# Patient Record
Sex: Female | Born: 1970 | Race: Black or African American | Hispanic: No | Marital: Single | Smoking: Never smoker
Health system: Southern US, Community
[De-identification: ages and names within clinical notes are randomized; demographics above are authoritative.]

## PROBLEM LIST (undated history)

## (undated) DIAGNOSIS — Z789 Other specified health status: Secondary | ICD-10-CM

## (undated) HISTORY — PX: NO PAST SURGERIES: SHX2092

---

## 2011-12-11 ENCOUNTER — Emergency Department (HOSPITAL_COMMUNITY)
Admission: EM | Admit: 2011-12-11 | Discharge: 2011-12-11 | Discharge status: Against Medical Advice | Payer: PRIVATE HEALTH INSURANCE | Attending: Emergency Medicine | Admitting: Emergency Medicine

## 2011-12-11 DIAGNOSIS — R42 Dizziness and giddiness: Secondary | ICD-10-CM | POA: Insufficient documentation

## 2011-12-11 DIAGNOSIS — R51 Headache: Secondary | ICD-10-CM | POA: Insufficient documentation

## 2012-08-23 ENCOUNTER — Inpatient Hospital Stay (HOSPITAL_COMMUNITY)
Admission: AD | Admit: 2012-08-23 | Discharge: 2012-08-23 | Disposition: A | Discharge status: Home / Self Care | Payer: Medicaid Other | Source: Ambulatory Visit | Attending: Obstetrics & Gynecology | Admitting: Obstetrics & Gynecology

## 2012-08-23 ENCOUNTER — Inpatient Hospital Stay (HOSPITAL_COMMUNITY): Payer: Medicaid Other

## 2012-08-23 ENCOUNTER — Encounter (HOSPITAL_COMMUNITY): Payer: Self-pay | Admitting: *Deleted

## 2012-08-23 DIAGNOSIS — N949 Unspecified condition associated with female genital organs and menstrual cycle: Secondary | ICD-10-CM

## 2012-08-23 DIAGNOSIS — N938 Other specified abnormal uterine and vaginal bleeding: Secondary | ICD-10-CM | POA: Insufficient documentation

## 2012-08-23 HISTORY — DX: Other specified health status: Z78.9

## 2012-08-23 LAB — URINALYSIS, ROUTINE W REFLEX MICROSCOPIC
Glucose, UA: NEGATIVE mg/dL
Protein, ur: >300 mg/dL — AB
pH: 6.5 (ref 5.0–8.0)

## 2012-08-23 LAB — WET PREP, GENITAL
Clue Cells Wet Prep HPF POC: NONE SEEN
Trich, Wet Prep: NONE SEEN
WBC, Wet Prep HPF POC: NONE SEEN
Yeast Wet Prep HPF POC: NONE SEEN

## 2012-08-23 LAB — CBC
HCT: 32.2 % — ABNORMAL LOW (ref 36.0–46.0)
MCH: 27.2 pg (ref 26.0–34.0)
MCHC: 32.9 g/dL (ref 30.0–36.0)
MCV: 82.6 fL (ref 78.0–100.0)
Platelets: 223 10*3/uL (ref 150–400)
RDW: 15.4 % (ref 11.5–15.5)
WBC: 3.2 10*3/uL — ABNORMAL LOW (ref 4.0–10.5)

## 2012-08-23 LAB — POCT PREGNANCY, URINE: Preg Test, Ur: NEGATIVE

## 2012-08-23 MED ORDER — MEDROXYPROGESTERONE ACETATE 10 MG PO TABS: 10.0000 mg | ORAL_TABLET | Freq: Every day | ORAL | Status: DC

## 2012-08-23 MED ORDER — SULFAMETHOXAZOLE-TMP DS 800-160 MG PO TABS: 1.0000 | ORAL_TABLET | Freq: Two times a day (BID) | ORAL | Status: DC

## 2012-08-23 NOTE — MAU Provider Note (Signed)
History     CSN: [id number]  Arrival date and time: 08/23/12 1057   First Provider Initiated Contact with Patient 08/23/12 1213      Chief Complaint  Patient presents with  . Vaginal Bleeding   HPI  42 y.o. [id number] with heavy vaginal bleeding since yesterday, passing golf ball to baseball sized clots since yesterday afternoon. Mild menstrual cramps. Periods are usually not heavy. This is the time that she expected her regular period to start.    Past Medical History  Diagnosis Date  . No pertinent past medical history     Past Surgical History  Procedure Date  . No past surgeries     Family History  Problem Relation Age of Onset  . Other Neg Hx     History  Substance Use Topics  . Smoking status: Never Smoker   . Smokeless tobacco: Not on file  . Alcohol Use: No    Allergies: No Known Allergies  Prescriptions prior to admission  Medication Sig Dispense Refill  . diphenhydrAMINE (BENADRYL) 25 MG tablet Take 25 mg by mouth daily as needed. For itching        Review of Systems  Constitutional: Positive for malaise/fatigue.  Respiratory: Negative.   Cardiovascular: Negative.   Gastrointestinal: Positive for abdominal pain. Negative for nausea, vomiting, diarrhea and constipation.  Genitourinary: Negative for dysuria, urgency, frequency, hematuria and flank pain.       Positive for vaginal bleeding   Musculoskeletal: Negative.   Neurological: Negative.   Psychiatric/Behavioral: Negative.    Physical Exam   Blood pressure 109/67, pulse 83, temperature 97.9 F (36.6 C), temperature source Oral, resp. rate 18, height 5\' 4"  (1.626 m), weight 190 lb (86.183 kg), last menstrual period 07/26/2012.  Physical Exam  Nursing note and vitals reviewed. Constitutional: She is oriented to person, place, and time. She appears well-developed and well-nourished. No distress.  HENT:  Head: Normocephalic and atraumatic.  Cardiovascular: Normal rate.   Respiratory: Effort  normal. No respiratory distress.  GI: Soft. Bowel sounds are normal. She exhibits no distension and no mass. There is no tenderness. There is no rebound and no guarding.  Genitourinary: There is no rash or lesion on the right labia. There is no rash or lesion on the left labia. Uterus is not deviated, not enlarged, not fixed and not tender. Cervix exhibits no motion tenderness, no discharge and no friability. Right adnexum displays no mass, no tenderness and no fullness. Left adnexum displays no mass, no tenderness and no fullness. There is bleeding (heavy with moderate clots) around the vagina. No erythema or tenderness around the vagina. No vaginal discharge found.  Neurological: She is alert and oriented to person, place, and time.  Skin: Skin is warm and dry.  Psychiatric: She has a normal mood and affect.    MAU Course  Procedures Results for orders placed during the hospital encounter of 08/23/12 (from the past 24 hour(s))  URINALYSIS, ROUTINE W REFLEX MICROSCOPIC     Status: Abnormal   Collection Time   08/23/12 11:04 AM      Component Value Range   Color, Urine RED (*) YELLOW   APPearance CLOUDY (*) CLEAR   Specific Gravity, Urine 1.015  1.005 - 1.030   pH 6.5  5.0 - 8.0   Glucose, UA NEGATIVE  NEGATIVE mg/dL   Hgb urine dipstick LARGE (*) NEGATIVE   Bilirubin Urine NEGATIVE  NEGATIVE   Ketones, ur 15 (*) NEGATIVE mg/dL   Protein, ur >696 (*) NEGATIVE  mg/dL   Urobilinogen, UA 1.0  0.0 - 1.0 mg/dL   Nitrite POSITIVE (*) NEGATIVE   Leukocytes, UA TRACE (*) NEGATIVE  URINE MICROSCOPIC-ADD ON     Status: Normal   Collection Time   08/23/12 11:04 AM      Component Value Range   RBC / HPF TOO NUMEROUS TO COUNT  <3 RBC/hpf   Urine-Other MICROSCOPIC EXAM PERFORMED ON UNCONCENTRATED URINE    POCT PREGNANCY, URINE     Status: Normal   Collection Time   08/23/12 11:14 AM      Component Value Range   Preg Test, Ur NEGATIVE  NEGATIVE  CBC     Status: Abnormal   Collection Time   08/23/12  11:35 AM      Component Value Range   WBC 3.2 (*) 4.0 - 10.5 K/uL   RBC 3.90  3.87 - 5.11 MIL/uL   Hemoglobin 10.6 (*) 12.0 - 15.0 g/dL   HCT 16.1 (*) 09.6 - 04.5 %   MCV 82.6  78.0 - 100.0 fL   MCH 27.2  26.0 - 34.0 pg   MCHC 32.9  30.0 - 36.0 g/dL   RDW 40.9  81.1 - 91.4 %   Platelets 223  150 - 400 K/uL   US Transvaginal Non-ob  08/23/2012  *RADIOLOGY REPORT*  Clinical Data: Heavy vaginal bleeding.  LMP 08/22/2012  TRANSABDOMINAL AND TRANSVAGINAL ULTRASOUND OF PELVIS Technique:  Both transabdominal and transvaginal ultrasound examinations of the pelvis were performed. Transabdominal technique was performed for global imaging of the pelvis including uterus, ovaries, adnexal regions, and pelvic cul-de-sac.  It was necessary to proceed with endovaginal exam following the transabdominal exam to visualize the myometrium, endometrium and adnexa.  Comparison:  None  Findings:  Uterus: Is anteverted and anteflexed and demonstrates a sagittal length of 9.4 cm, depth of 4.5 cm and width of 5.1 cm.  A homogeneous myometrium is seen  Endometrium: Appears thin with a width of 5 mm.  No areas of focal thickening or heterogeneity are identified. This would correlate with a postsecretory endometrial stripe and corresponds with the provided LMP of 08/22/2012  Right ovary:  Has a normal appearance measuring 2.5 x 1.7 x 2.1 cm  Left ovary: Has a normal appearance measuring 2.4 x 1.6 x 2.4 cm  Other findings: A thin walled unilocular simple cyst is noted in the right adnexa measuring 1.4 x 1.3 x 1.3 cm and compatible with a benign paraovarian cyst.  No pelvic fluid is seen.  IMPRESSION: Normal myometrium, endometrium and ovaries.  Benign appearing right paraovarian cyst.  Given the size and appearance in a premenopausal patient, no further follow up for this finding is needed This recommendation follows the consensus statement:  Management of Asymptomatic Ovarian and Other Adnexal Cysts Imaged at [place]:  Society of  Radiologists in Ultrasound Consensus Conference Statement.  Radiology 2010; [phone]. .   Original Report Authenticated By: Rhodia Albright, M.D.    US Pelvis Complete  08/23/2012  *RADIOLOGY REPORT*  Clinical Data: Heavy vaginal bleeding.  LMP 08/22/2012  TRANSABDOMINAL AND TRANSVAGINAL ULTRASOUND OF PELVIS Technique:  Both transabdominal and transvaginal ultrasound examinations of the pelvis were performed. Transabdominal technique was performed for global imaging of the pelvis including uterus, ovaries, adnexal regions, and pelvic cul-de-sac.  It was necessary to proceed with endovaginal exam following the transabdominal exam to visualize the myometrium, endometrium and adnexa.  Comparison:  None  Findings:  Uterus: Is anteverted and anteflexed and demonstrates a sagittal length of 9.4 cm,  depth of 4.5 cm and width of 5.1 cm.  A homogeneous myometrium is seen  Endometrium: Appears thin with a width of 5 mm.  No areas of focal thickening or heterogeneity are identified. This would correlate with a postsecretory endometrial stripe and corresponds with the provided LMP of 08/22/2012  Right ovary:  Has a normal appearance measuring 2.5 x 1.7 x 2.1 cm  Left ovary: Has a normal appearance measuring 2.4 x 1.6 x 2.4 cm  Other findings: A thin walled unilocular simple cyst is noted in the right adnexa measuring 1.4 x 1.3 x 1.3 cm and compatible with a benign paraovarian cyst.  No pelvic fluid is seen.  IMPRESSION: Normal myometrium, endometrium and ovaries.  Benign appearing right paraovarian cyst.  Given the size and appearance in a premenopausal patient, no further follow up for this finding is needed This recommendation follows the consensus statement:  Management of Asymptomatic Ovarian and Other Adnexal Cysts Imaged at [place]:  Society of Radiologists in Ultrasound Consensus Conference Statement.  Radiology 2010; [phone]. .   Original Report Authenticated By: Rhodia Albright, M.D.      Assessment and Plan    1. DUB (dysfunctional uterine bleeding)   Heavy period - normal u/s, Hgb 10.6. Gave rx for provera - pt may wait to see if this period stops within the next few days as her cycles are normally about 5 days long, but may take provera to stop bleeding if desired/needed. F/U in clinic.     Medication List     As of 08/23/2012  6:16 PM    START taking these medications         medroxyPROGESTERone 10 MG tablet   Commonly known as: PROVERA   Take 1 tablet (10 mg total) by mouth daily.      sulfamethoxazole-trimethoprim 800-160 MG per tablet   Commonly known as: BACTRIM DS   Take 1 tablet by mouth 2 (two) times daily.      CONTINUE taking these medications         diphenhydrAMINE 25 MG tablet   Commonly known as: BENADRYL          Where to get your medications    These are the prescriptions that you need to pick up. We sent them to a specific pharmacy, so you will need to go there to get them.   [id number] [place], [place] - [address]    [address] [place] [address]    Phone: [phone]        medroxyPROGESTERone 10 MG tablet   sulfamethoxazole-trimethoprim 800-160 MG per tablet            Follow-up Information    Follow up with [facility]. (someone will call to schedule appointment)    Contact information:   [address] [place] [address] [phone]           Bronco Mcgrory 08/23/2012, 12:30 PM

## 2012-08-23 NOTE — MAU Note (Signed)
C/o heavy bleeding since 1400 yesterday with "golf ball to baseball size clots" with no pain; no BC;

## 2012-09-09 ENCOUNTER — Encounter: Payer: PRIVATE HEALTH INSURANCE | Admitting: Obstetrics & Gynecology

## 2012-09-23 ENCOUNTER — Encounter: Payer: Self-pay | Admitting: Obstetrics & Gynecology

## 2012-09-23 ENCOUNTER — Ambulatory Visit (INDEPENDENT_AMBULATORY_CARE_PROVIDER_SITE_OTHER): Payer: Medicaid Other | Admitting: Obstetrics & Gynecology

## 2012-09-23 VITALS — BP 109/73 | HR 82 | Temp 97.9°F | Ht 64.0 in | Wt 210.0 lb

## 2012-09-23 DIAGNOSIS — N920 Excessive and frequent menstruation with regular cycle: Secondary | ICD-10-CM

## 2012-09-23 NOTE — Patient Instructions (Addendum)
Endometrial Ablation Endometrial ablation removes the lining of the uterus (endometrium). It is usually a same day, outpatient treatment. Ablation helps avoid major surgery (such as a hysterectomy). A hysterectomy is removal of the cervix and uterus. Endometrial ablation has less risk and complications, has a shorter recovery period and is less expensive. After endometrial ablation, most women will have little or no menstrual bleeding. You may not keep your fertility. Pregnancy is no longer likely after this procedure but if you are pre-menopausal, you still need to use a reliable method of birth control following the procedure because pregnancy can occur. REASONS TO HAVE THE PROCEDURE MAY INCLUDE:  Heavy periods.  Bleeding that is causing anemia.  Anovulatory bleeding, very irregular, bleeding.  Bleeding submucous fibroids (on the lining inside the uterus) if they are smaller than 3 centimeters. REASONS NOT TO HAVE THE PROCEDURE MAY INCLUDE:  You wish to have more children.  You have a pre-cancerous or cancerous problem. The cause of any abnormal bleeding must be diagnosed before having the procedure.  You have pain coming from the uterus.  You have a submucus fibroid larger than 3 centimeters.  You recently had a baby.  You recently had an infection in the uterus.  You have a severe retro-flexed, tipped uterus and cannot insert the instrument to do the ablation.  You had a Cesarean section or deep major surgery on the uterus.  The inner cavity of the uterus is too large for the endometrial ablation instrument. RISKS AND COMPLICATIONS   Perforation of the uterus.  Bleeding.  Infection of the uterus, bladder or vagina.  Injury to surrounding organs.  Cutting the cervix.  An air bubble to the lung (air embolus).  Pregnancy following the procedure.  Failure of the procedure to help the problem requiring hysterectomy.  Decreased ability to diagnose cancer in the lining of  the uterus. BEFORE THE PROCEDURE  The lining of the uterus must be tested to make sure there is no pre-cancerous or cancer cells present.  Medications may be given to make the lining of the uterus thinner.  Ultrasound may be used to evaluate the size and look for abnormalities of the uterus.  Future pregnancy is not desired. PROCEDURE  There are different ways to destroy the lining of the uterus.   Resectoscope - radio frequency-alternating electric current is the most common one used.  Cryotherapy - freezing the lining of the uterus.  Heated Free Liquid - heated salt (saline) solution inserted into the uterus.  Microwave - uses high energy microwaves in the uterus.  Thermal Balloon - a catheter with a balloon tip is inserted into the uterus and filled with heated fluid. Your caregiver will talk with you about the method used in this clinic. They will also instruct you on the pros and cons of the procedure. Endometrial ablation is performed along with a procedure called operative hysteroscopy. A narrow viewing tube is inserted through the birth canal (vagina) and through the cervix into the uterus. A tiny camera attached to the viewing tube (hysteroscope) allows the uterine cavity to be shown on a TV monitor during surgery. Your uterus is filled with a harmless liquid to make the procedure easier. The lining of the uterus is then removed. The lining can also be removed with a resectoscope which allows your surgeon to cut away the lining of the uterus under direct vision. Usually, you will be able to go home within an hour after the procedure. HOME CARE INSTRUCTIONS   Do   not drive for 24 hours.  No tampons, douching or intercourse for 2 weeks or until your caregiver approves.  Rest at home for 24 to 48 hours. You may then resume normal activities unless told differently by your caregiver.  Take your temperature two times a day for 4 days, and record it.  Take any medications your  caregiver has ordered, as directed.  Use some form of contraception if you are pre-menopausal and do not want to get pregnant. Bleeding after the procedure is normal. It varies from light spotting and mildly watery to bloody discharge for 4 to 6 weeks. You may also have mild cramping. Only take over-the-counter or prescription medicines for pain, discomfort, or fever as directed by your caregiver. Do not use aspirin, as this may aggravate bleeding. Frequent urination during the first 24 hours is normal. You will not know how effective your surgery is until at least 3 months after the surgery. SEEK IMMEDIATE MEDICAL CARE IF:   Bleeding is heavier than a normal menstrual cycle.  An oral temperature above 102 F (38.9 C) develops.  You have increasing cramps or pains not relieved with medication or develop belly (abdominal) pain which does not seem to be related to the same area of earlier cramping and pain.  You are light headed, weak or have fainting episodes.  You develop pain in the shoulder strap areas.  You have chest or leg pain.  You have abnormal vaginal discharge.  You have painful urination. Document Released: 05/12/2004 Document Revised: 09/25/2011 Document Reviewed: 08/10/2007 [facility] Patient Information 2013 [place], [place]. Menorrhagia Dysfunctional uterine bleeding is different from a normal menstrual period. When periods are heavy or there is more bleeding than is usual for you, it is called menorrhagia. It may be caused by hormonal imbalance, or physical, metabolic, or other problems. Examination is necessary in order that your caregiver may treat treatable causes. If this is a continuing problem, a D&C may be needed. That means that the cervix (the opening of the uterus or womb) is dilated (stretched larger) and the lining of the uterus is scraped out. The tissue scraped out is then examined under a microscope by a specialist (pathologist) to make sure there is nothing of  concern that needs further or more extensive treatment. HOME CARE INSTRUCTIONS   If medications were prescribed, take exactly as directed. Do not change or switch medications without consulting your caregiver.  Long term heavy bleeding may result in iron deficiency. Your caregiver may have prescribed iron pills. They help replace the iron your body lost from heavy bleeding. Take exactly as directed. Iron may cause constipation. If this becomes a problem, increase the bran, fruits, and roughage in your diet.  Do not take aspirin or medicines that contain aspirin one week before or during your menstrual period. Aspirin may make the bleeding worse.  If you need to change your sanitary pad or tampon more than once every 2 hours, stay in bed and rest as much as possible until the bleeding stops.  Eat well-balanced meals. Eat foods high in iron. Examples are leafy green vegetables, meat, liver, eggs, and whole grain breads and cereals. Do not try to lose weight until the abnormal bleeding has stopped and your blood iron level is back to normal. SEEK MEDICAL CARE IF:   You need to change your sanitary pad or tampon more than once an hour.  You develop nausea (feeling sick to your stomach) and vomiting, dizziness, or diarrhea while you are taking your  medicine.  You have any problems that may be related to the medicine you are taking. SEEK IMMEDIATE MEDICAL CARE IF:   You have a fever.  You develop chills.  You develop severe bleeding or start to pass blood clots.  You feel dizzy or faint. MAKE SURE YOU:   Understand these instructions.  Will watch your condition.  Will get help right away if you are not doing well or get worse. Document Released: 07/03/2005 Document Revised: 09/25/2011 Document Reviewed: 02/21/2008 [facility] Patient Information 2013 [place], [place].

## 2012-09-23 NOTE — Progress Notes (Signed)
Subjective:     Patient ID: Tara Spencer, female   DOB: [birth year], 42 y.o.   MRN: [id number]  HPI Pt presents as a f/u from the MAU.  She was seen in Feb for c/o 1 heavy cycle.  She reports that by the next day the bleeding had improved and the patient never had to take the meds prescribed.  She says that the bleeding tha tnight was very heavy with clots.  She had a cycle recently which was normal.  Past Medical History  Diagnosis Date  . No pertinent past medical history    Past Surgical History  Procedure Laterality Date  . No past surgeries     Current Outpatient Prescriptions on File Prior to Visit  Medication Sig Dispense Refill  . diphenhydrAMINE (BENADRYL) 25 MG tablet Take 25 mg by mouth daily as needed. For itching      . medroxyPROGESTERone (PROVERA) 10 MG tablet Take 1 tablet (10 mg total) by mouth daily.  10 tablet  0   No current facility-administered medications on file prior to visit.   Allergies  Allergen Reactions  . Sulfa Antibiotics Shortness Of Breath and Palpitations   History   Social History  . Marital Status: Single    Spouse Name: N/A    Number of Children: N/A  . Years of Education: N/A   Occupational History  . Not on file.   Social History Main Topics  . Smoking status: Never Smoker   . Smokeless tobacco: Never Used  . Alcohol Use: Yes     Comment: social   . Drug Use: No  . Sexually Active: Not Currently   Other Topics Concern  . Not on file   Social History Narrative  . No narrative on file      Review of Systems     Objective:   Physical Exam BP 109/73  Pulse 82  Temp(Src) 97.9 F (36.6 C) (Oral)  Ht 5\' 4"  (1.626 m)  Wt 210 lb (95.255 kg)  BMI 36.03 kg/m2  LMP 08/21/2012  Exam deferred  sono 08/23/2012 Clinical Data: Heavy vaginal bleeding. LMP 08/22/2012  TRANSABDOMINAL AND TRANSVAGINAL ULTRASOUND OF PELVIS  Technique: Both transabdominal and transvaginal ultrasound  examinations of the pelvis were performed.  Transabdominal technique  was performed for global imaging of the pelvis including uterus,  ovaries, adnexal regions, and pelvic cul-de-sac.  It was necessary to proceed with endovaginal exam following the  transabdominal exam to visualize the myometrium, endometrium and  adnexa.  Comparison: None  Findings:  Uterus: Is anteverted and anteflexed and demonstrates a sagittal  length of 9.4 cm, depth of 4.5 cm and width of 5.1 cm. A  homogeneous myometrium is seen  Endometrium: Appears thin with a width of 5 mm. No areas of focal  thickening or heterogeneity are identified. This would correlate  with a postsecretory endometrial stripe and corresponds with the  provided LMP of 08/22/2012  Right ovary: Has a normal appearance measuring 2.5 x 1.7 x 2.1 cm  Left ovary: Has a normal appearance measuring 2.4 x 1.6 x 2.4 cm  Other findings: A thin walled unilocular simple cyst is noted in  the right adnexa measuring 1.4 x 1.3 x 1.3 cm and compatible with a  benign paraovarian cyst. No pelvic fluid is seen.  IMPRESSION:  Normal myometrium, endometrium and ovaries.  Benign appearing right paraovarian cyst. Given the size and  appearance in a premenopausal patient, no further follow up for  this finding is needed This recommendation  follows the consensus  statement: Management of Asymptomatic Ovarian and Other Adnexal  Cysts Imaged at [place]: Society of Radiologists in Ultrasound  Consensus Conference Statement. Radiology 2010; [phone]. .      Assessment:     Menorrhagia x 1 episode resolved      Plan:     F/u 3 months for annual or sooner prn Pt given handouts on Endometrial ablation and Mirena so she will have the info if her sx return

## 2014-05-18 ENCOUNTER — Encounter: Payer: Self-pay | Admitting: Obstetrics & Gynecology

## 2016-12-14 ENCOUNTER — Emergency Department (HOSPITAL_BASED_OUTPATIENT_CLINIC_OR_DEPARTMENT_OTHER)
Admission: EM | Admit: 2016-12-14 | Discharge: 2016-12-15 | Disposition: A | Discharge status: Home / Self Care | Payer: BLUE CROSS/BLUE SHIELD | Attending: Emergency Medicine | Admitting: Emergency Medicine

## 2016-12-14 ENCOUNTER — Emergency Department (HOSPITAL_BASED_OUTPATIENT_CLINIC_OR_DEPARTMENT_OTHER): Payer: BLUE CROSS/BLUE SHIELD

## 2016-12-14 ENCOUNTER — Encounter (HOSPITAL_BASED_OUTPATIENT_CLINIC_OR_DEPARTMENT_OTHER): Payer: Self-pay | Admitting: *Deleted

## 2016-12-14 DIAGNOSIS — R0602 Shortness of breath: Secondary | ICD-10-CM | POA: Diagnosis not present

## 2016-12-14 DIAGNOSIS — R072 Precordial pain: Secondary | ICD-10-CM | POA: Insufficient documentation

## 2016-12-14 DIAGNOSIS — R002 Palpitations: Secondary | ICD-10-CM | POA: Diagnosis not present

## 2016-12-14 NOTE — ED Triage Notes (Signed)
She has been having a fluttering feeling and sob x 2 nights. States it is worse when she sits or lays down. Described at feeling her heart beat pound in her ears.

## 2016-12-15 LAB — COMPREHENSIVE METABOLIC PANEL
ALK PHOS: 72 U/L (ref 38–126)
ALT: 15 U/L (ref 14–54)
ANION GAP: 8 (ref 5–15)
AST: 24 U/L (ref 15–41)
Albumin: 3.9 g/dL (ref 3.5–5.0)
BUN: 9 mg/dL (ref 6–20)
CALCIUM: 8.8 mg/dL — AB (ref 8.9–10.3)
CO2: 24 mmol/L (ref 22–32)
Chloride: 104 mmol/L (ref 101–111)
Creatinine, Ser: 0.76 mg/dL (ref 0.44–1.00)
GFR calc non Af Amer: >60 mL/min (ref >60)
Glucose, Bld: 101 mg/dL — ABNORMAL HIGH (ref 65–99)
POTASSIUM: 3.5 mmol/L (ref 3.5–5.1)
SODIUM: 136 mmol/L (ref 135–145)
TOTAL PROTEIN: 7.1 g/dL (ref 6.5–8.1)
Total Bilirubin: 0.5 mg/dL (ref 0.3–1.2)

## 2016-12-15 LAB — TROPONIN I: Troponin I: <0.03 ng/mL (ref <0.03)

## 2016-12-15 LAB — CBC WITH DIFFERENTIAL/PLATELET
Basophils Absolute: 0 10*3/uL (ref 0.0–0.1)
Basophils Relative: 1 %
EOS ABS: 0.1 10*3/uL (ref 0.0–0.7)
EOS PCT: 2 %
HCT: 36.3 % (ref 36.0–46.0)
HEMOGLOBIN: 11.8 g/dL — AB (ref 12.0–15.0)
Lymphocytes Relative: 46 %
Lymphs Abs: 2.7 10*3/uL (ref 0.7–4.0)
MCH: 25.9 pg — AB (ref 26.0–34.0)
MCHC: 32.5 g/dL (ref 30.0–36.0)
MCV: 79.8 fL (ref 78.0–100.0)
MONO ABS: 0.4 10*3/uL (ref 0.1–1.0)
MONOS PCT: 6 %
Neutro Abs: 2.6 10*3/uL (ref 1.7–7.7)
Neutrophils Relative %: 45 %
Platelets: 309 10*3/uL (ref 150–400)
RBC: 4.55 MIL/uL (ref 3.87–5.11)
RDW: 14.9 % (ref 11.5–15.5)
WBC: 5.8 10*3/uL (ref 4.0–10.5)

## 2016-12-15 NOTE — ED Provider Notes (Signed)
[facility] Provider [facility]   CSN: [id number] Arrival date & time: 12/14/16  2236     History   Chief Complaint Chief Complaint  Patient presents with  . Shortness of Breath  . Palpitations    HPI Tara Spencer is a 46 y.o. female.  The history is provided by the patient.  Palpitations   This is a new problem. The current episode started 2 days ago. The problem has been gradually improving. Associated with: sleeping. Associated symptoms include chest pressure and shortness of breath. Pertinent negatives include no diaphoresis, no fever, no syncope and no cough. Risk factors include obesity.  patient without any significant known medical conditions presents with CP/palpitations for past 2 nights It seems to worsen at night while trying to sleep She feels her heart beating in her ears at times She reports fluttering in her chest She also has some chest pressure She has SOB No syncope No fever/vomiting No other pain complaints No known h/o CAD/PE/DVT  fam hx - negative for premature CAD Soc hx - nonsmoker  Past Medical History:  Diagnosis Date  . No pertinent past medical history     There are no active problems to display for this patient.   Past Surgical History:  Procedure Laterality Date  . NO PAST SURGERIES      OB History    Gravida Para Term Preterm AB Living   6 6 6     6    SAB TAB Ectopic Multiple Live Births                   Home Medications    Prior to Admission medications   Not on File    Family History Family History  Problem Relation Age of Onset  . Other Neg Hx     Social History Social History  Substance Use Topics  . Smoking status: Never Smoker  . Smokeless tobacco: Never Used  . Alcohol use Yes     Comment: social      Allergies   Sulfa antibiotics   Review of Systems Review of Systems  Constitutional: Negative for diaphoresis and fever.  Respiratory: Positive for shortness of breath. Negative for  cough.   Cardiovascular: Positive for palpitations. Negative for syncope.  All other systems reviewed and are negative.    Physical Exam Updated Vital Signs BP 133/76   Pulse 76   Temp 98.9 F (37.2 C) (Oral)   Resp 20   Ht 1.626 m (5\' 4" )   Wt 95.3 kg (210 lb)   LMP 12/12/2016   SpO2 100%   BMI 36.05 kg/m   Physical Exam CONSTITUTIONAL: Well developed/well nourished HEAD: Normocephalic/atraumatic EYES: EOMI/PERRL ENMT: Mucous membranes moist NECK: supple no meningeal signs SPINE/BACK:entire spine nontender CV: S1/S2 noted, no murmurs/rubs/gallops noted LUNGS: Lungs are clear to auscultation bilaterally, no apparent distress ABDOMEN: soft, nontender, no rebound or guarding, bowel sounds noted throughout abdomen GU:no cva tenderness NEURO: Pt is awake/alert/appropriate, moves all extremitiesx4.  No facial droop.   EXTREMITIES: pulses normal/equal, full ROM, no calf tenderness SKIN: warm, color normal PSYCH: no abnormalities of mood noted, alert and oriented to situation   ED Treatments / Results  Labs (all labs ordered are listed, but only abnormal results are displayed) Labs Reviewed  CBC WITH DIFFERENTIAL/PLATELET - Abnormal; Notable for the following:       Result Value   Hemoglobin 11.8 (*)    MCH 25.9 (*)    All other components within normal limits  COMPREHENSIVE METABOLIC PANEL - Abnormal; Notable for the following:    Glucose, Bld 101 (*)    Calcium 8.8 (*)    All other components within normal limits  TROPONIN I  TROPONIN I    EKG  EKG Interpretation  Date/Time:  Thursday Dec 14 2016 22:46:47 EDT Ventricular Rate:  77 PR Interval:  136 QRS Duration: 78 QT Interval:  382 QTC Calculation: 432 R Axis:   79 Text Interpretation:  Normal sinus rhythm Normal ECG No previous ECGs available Confirmed by Zadie RhineWickline, Coutney Wildermuth ([id number]) on 12/15/2016 1:01:20 AM       Radiology Dg Chest 2 View  Result Date: 12/15/2016 CLINICAL DATA:  Acute onset of  generalized chest pressure and shortness of breath. Initial encounter. EXAM: CHEST  2 VIEW COMPARISON:  None. FINDINGS: The lungs are well-aerated and clear. There is no evidence of focal opacification, pleural effusion or pneumothorax. The heart is normal in size; the mediastinal contour is within normal limits. No acute osseous abnormalities are seen. IMPRESSION: No acute cardiopulmonary process seen. Electronically Signed   By: Roanna RaiderJeffery  Chang M.D.   On: 12/15/2016 00:02    Procedures Procedures (including critical care time)  Medications Ordered in ED Medications - No data to display   Initial Impression / Assessment and Plan / ED Course  I have reviewed the triage vital signs and the nursing notes.  Pertinent labs & imaging results that were available during my care of the patient were reviewed by me and considered in my medical decision making (see chart for details).     2:12 AM HEART score 3, I doubt ACS at this time  She appears PERC negative - she does not take estrogen products Will refer for outpatient workup/holter monitoring   Pt feeling improved in the ED at time of discharge She is well appearing/no distress  Discussed strict ER precautions with patient  including worsened chest pain/shortness of breath/syncope All pertinent questions answered prior to discharge Patient appropriate and safe for discharge at this time    Final Clinical Impressions(s) / ED Diagnoses   Final diagnoses:  Palpitations  Precordial pain    New Prescriptions New Prescriptions   No medications on file     Zadie RhineWickline, Hartley Urton, MD 12/15/16 [phone]

## 2017-09-16 ENCOUNTER — Encounter (HOSPITAL_BASED_OUTPATIENT_CLINIC_OR_DEPARTMENT_OTHER): Payer: Self-pay | Admitting: Adult Health

## 2017-09-16 ENCOUNTER — Other Ambulatory Visit: Payer: Self-pay

## 2017-09-16 ENCOUNTER — Emergency Department (HOSPITAL_BASED_OUTPATIENT_CLINIC_OR_DEPARTMENT_OTHER)
Admission: EM | Admit: 2017-09-16 | Discharge: 2017-09-16 | Disposition: A | Discharge status: Home / Self Care | Payer: Worker's Compensation | Attending: Emergency Medicine | Admitting: Emergency Medicine

## 2017-09-16 DIAGNOSIS — Z23 Encounter for immunization: Secondary | ICD-10-CM | POA: Insufficient documentation

## 2017-09-16 DIAGNOSIS — W461XXA Contact with contaminated hypodermic needle, initial encounter: Secondary | ICD-10-CM | POA: Diagnosis not present

## 2017-09-16 DIAGNOSIS — S61239A Puncture wound without foreign body of unspecified finger without damage to nail, initial encounter: Secondary | ICD-10-CM

## 2017-09-16 DIAGNOSIS — Y990 Civilian activity done for income or pay: Secondary | ICD-10-CM | POA: Insufficient documentation

## 2017-09-16 DIAGNOSIS — Z114 Encounter for screening for human immunodeficiency virus [HIV]: Secondary | ICD-10-CM | POA: Insufficient documentation

## 2017-09-16 DIAGNOSIS — W273XXA Contact with needle (sewing), initial encounter: Secondary | ICD-10-CM

## 2017-09-16 DIAGNOSIS — Y9389 Activity, other specified: Secondary | ICD-10-CM | POA: Diagnosis not present

## 2017-09-16 DIAGNOSIS — Y92524 Gas station as the place of occurrence of the external cause: Secondary | ICD-10-CM | POA: Diagnosis not present

## 2017-09-16 DIAGNOSIS — S61233A Puncture wound without foreign body of left middle finger without damage to nail, initial encounter: Secondary | ICD-10-CM | POA: Diagnosis not present

## 2017-09-16 DIAGNOSIS — IMO0001 Reserved for inherently not codable concepts without codable children: Secondary | ICD-10-CM

## 2017-09-16 LAB — CBC WITH DIFFERENTIAL/PLATELET
BASOS PCT: 1 %
Basophils Absolute: 0 10*3/uL (ref 0.0–0.1)
EOS ABS: 0.1 10*3/uL (ref 0.0–0.7)
Eosinophils Relative: 2 %
HEMATOCRIT: 34.2 % — AB (ref 36.0–46.0)
HEMOGLOBIN: 10.9 g/dL — AB (ref 12.0–15.0)
LYMPHS ABS: 2.1 10*3/uL (ref 0.7–4.0)
Lymphocytes Relative: 40 %
MCH: 25.3 pg — ABNORMAL LOW (ref 26.0–34.0)
MCHC: 31.9 g/dL (ref 30.0–36.0)
MCV: 79.5 fL (ref 78.0–100.0)
MONO ABS: 0.3 10*3/uL (ref 0.1–1.0)
MONOS PCT: 6 %
NEUTROS PCT: 51 %
Neutro Abs: 2.8 10*3/uL (ref 1.7–7.7)
Platelets: 343 10*3/uL (ref 150–400)
RBC: 4.3 MIL/uL (ref 3.87–5.11)
RDW: 15.7 % — ABNORMAL HIGH (ref 11.5–15.5)
WBC: 5.3 10*3/uL (ref 4.0–10.5)

## 2017-09-16 LAB — COMPREHENSIVE METABOLIC PANEL
ALK PHOS: 100 U/L (ref 38–126)
ALT: 12 U/L — ABNORMAL LOW (ref 14–54)
ANION GAP: 8 (ref 5–15)
AST: 20 U/L (ref 15–41)
Albumin: 3.8 g/dL (ref 3.5–5.0)
BILIRUBIN TOTAL: 0.3 mg/dL (ref 0.3–1.2)
BUN: 7 mg/dL (ref 6–20)
CALCIUM: 9 mg/dL (ref 8.9–10.3)
CO2: 23 mmol/L (ref 22–32)
Chloride: 107 mmol/L (ref 101–111)
Creatinine, Ser: 0.8 mg/dL (ref 0.44–1.00)
GFR calc non Af Amer: >60 mL/min (ref >60)
Glucose, Bld: 83 mg/dL (ref 65–99)
Potassium: 3.4 mmol/L — ABNORMAL LOW (ref 3.5–5.1)
Sodium: 138 mmol/L (ref 135–145)
TOTAL PROTEIN: 7.4 g/dL (ref 6.5–8.1)

## 2017-09-16 LAB — RAPID HIV SCREEN (HIV 1/2 AB+AG)
HIV 1/2 ANTIBODIES: NONREACTIVE
HIV-1 P24 Antigen - HIV24: NONREACTIVE

## 2017-09-16 MED ORDER — EMTRICITABINE-TENOFOVIR DF 200-300 MG PO TABS: 1.0000 | ORAL_TABLET | Freq: Every day | ORAL | 0 refills | Status: AC

## 2017-09-16 MED ORDER — TETANUS-DIPHTH-ACELL PERTUSSIS 5-2.5-18.5 LF-MCG/0.5 IM SUSP
0.5000 mL | Freq: Once | INTRAMUSCULAR | Status: AC
  Administered 2017-09-16: 0.5 mL via INTRAMUSCULAR
  Filled 2017-09-16: qty 0.5

## 2017-09-16 MED ORDER — DOLUTEGRAVIR SODIUM 50 MG PO TABS: 50.0000 mg | ORAL_TABLET | Freq: Every day | ORAL | 0 refills | Status: AC

## 2017-09-16 NOTE — Discharge Instructions (Signed)
You have been tested for HIV and hepatitis. You will be notified if these results are positive.  You need to follow up with your primary care doctor. Call them tomorrow to schedule a follow up appointment. Please let them know that we recommended a Hepatitis vaccine, however do not have them in stock in the Emergency Department, therefore recommend that you get this done at your primary care doctor.  You were given two medications today. It is important that you take these as directed to prevent HIV infection.  Return to ER for new or worsening symptoms, any additional concerns.

## 2017-09-16 NOTE — ED Triage Notes (Signed)
PResents with needle stick to right hand. 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 She went through the trash and found the insulin syringe and needle, with blood in the hub.

## 2017-09-16 NOTE — ED Provider Notes (Addendum)
[facility] EMERGENCY DEPARTMENT Provider Note   CSN: [id number] Arrival date & time: 09/16/17  1440     History   Chief Complaint Chief Complaint  Patient presents with  . Body Fluid Exposure    HPI Tara Spencer is a 47 y.o. female.  The history is provided by the patient and medical records. No language interpreter was used.  Body Fluid Exposure   Tara Spencer is an otherwise healthy 47 y.o. female  who presents to the Emergency Department for evaluation after needlestick.  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  Unfortunately, this was a needle that was placed in the trash can without it.  Finger did bleed a little bit, but quit without any intervention.  She called her boss who recommended that she come to the ER for further evaluation.  Past Medical History:  Diagnosis Date  . No pertinent past medical history     There are no active problems to display for this patient.   Past Surgical History:  Procedure Laterality Date  . NO PAST SURGERIES      OB History    Gravida Para Term Preterm AB Living   6 6 6     6    SAB TAB Ectopic Multiple Live Births                   Home Medications    Prior to Admission medications   Medication Sig Start Date End Date Taking? Authorizing Provider  dolutegravir (TIVICAY) 50 MG tablet Take 1 tablet (50 mg total) by mouth daily. 09/16/17   Romon Devereux, Chase PicketJaime Pilcher, PA-C  emtricitabine-tenofovir (TRUVADA) 200-300 MG tablet Take 1 tablet by mouth daily. 09/16/17   Ardyn Forge, Chase PicketJaime Pilcher, PA-C    Family History Family History  Problem Relation Age of Onset  . Other Neg Hx     Social History Social History   Tobacco Use  . Smoking status: Never Smoker  . Smokeless tobacco: Never Used  Substance Use Topics  . Alcohol use: Yes    Comment: social   . Drug use: No     Allergies   Sulfa  antibiotics   Review of Systems Review of Systems  Musculoskeletal: Positive for myalgias.  Skin: Positive for wound.  Neurological: Negative for weakness and numbness.     Physical Exam Updated Vital Signs BP 121/74 (BP Location: Right Arm)   Pulse 78   Temp 98.6 F (37 C) (Oral)   Resp 18   LMP 08/27/2017   SpO2 100%   Physical Exam  Constitutional: She appears well-developed and well-nourished. No distress.  HENT:  Head: Normocephalic and atraumatic.  Neck: Neck supple.  Cardiovascular: Normal rate, regular rhythm and normal heart sounds.  No murmur heard. Pulmonary/Chest: Effort normal and breath sounds normal. No respiratory distress. She has no wheezes. She has no rales.  Musculoskeletal: Normal range of motion.  Neurological: She is alert.  Skin: Skin is warm and dry.  Pinpoint mark to left middle finger likely 2/2 finger stick. No active bleeding.   Nursing note and vitals reviewed.    ED Treatments / Results  Labs (all labs ordered are listed, but only abnormal results are displayed) Labs Reviewed  CBC WITH DIFFERENTIAL/PLATELET - Abnormal; Notable for the following components:      Result Value   Hemoglobin 10.9 (*)    HCT 34.2 (*)  MCH 25.3 (*)    RDW 15.7 (*)    All other components within normal limits  COMPREHENSIVE METABOLIC PANEL - Abnormal; Notable for the following components:   Potassium 3.4 (*)    ALT 12 (*)    All other components within normal limits  RAPID HIV SCREEN (HIV 1/2 AB+AG)  HEPATITIS PANEL, ACUTE    EKG  EKG Interpretation None       Radiology No results found.  Procedures  Procedures (including critical care time)  Medications Ordered in ED Medications  Tdap (BOOSTRIX) injection 0.5 mL (0.5 mLs Intramuscular Given 09/16/17 1806)     Initial Impression / Assessment and Plan / ED Course  I have reviewed the triage vital signs and the nursing notes.  Pertinent labs & imaging results that were available during  my care of the patient were reviewed by me and considered in my medical decision making (see chart for details).    Tara Spencer is a 47 y.o. female who presents to ED for evaluation after needle stick today. Patient works at [employer] and was stuck by needle which was in the bathroom trash when she was cleaning the bathroom. Did break skin and have small amount of bleeding which self resolved. Wound cleaned in ED. Baseline labs reviewed and reassuring. HIV baseline negative. Hepatitis panel sent. Given needlestick from unknown person, strongly encouraged HIV prophylaxis and patient agrees. Started on Truvada and Tivicay per uptodate recommendations. No hepatitis B vaccines at this facility. She will be following up with PCP and recommended she get vaccine at PCP office. Home care, follow up plan and return precautions discussed. All questions answered.   Patient discussed with Dr. Madilyn Hook who agrees with treatment plan.    Final Clinical Impressions(s) / ED Diagnoses   Final diagnoses:  Needlestick injury of finger, initial encounter    ED Discharge Orders        Ordered    emtricitabine-tenofovir (TRUVADA) 200-300 MG tablet  Daily     09/16/17 1856    dolutegravir (TIVICAY) 50 MG tablet  Daily     09/16/17 1856       Clarabel Marion, Chase Picket, PA-C 09/16/17 2235    Tilden Fossa, MD 09/17/17 [phone]

## 2017-09-18 LAB — HEPATITIS PANEL, ACUTE
HCV Ab: <0.1 s/co ratio (ref 0.0–0.9)
HEP B C IGM: NEGATIVE
Hep A IgM: NEGATIVE
Hepatitis B Surface Ag: NEGATIVE

## 2018-12-07 ENCOUNTER — Emergency Department (HOSPITAL_BASED_OUTPATIENT_CLINIC_OR_DEPARTMENT_OTHER): Payer: BLUE CROSS/BLUE SHIELD

## 2018-12-07 ENCOUNTER — Encounter (HOSPITAL_BASED_OUTPATIENT_CLINIC_OR_DEPARTMENT_OTHER): Payer: Self-pay | Admitting: *Deleted

## 2018-12-07 ENCOUNTER — Emergency Department (HOSPITAL_BASED_OUTPATIENT_CLINIC_OR_DEPARTMENT_OTHER)
Admission: EM | Admit: 2018-12-07 | Discharge: 2018-12-08 | Disposition: A | Discharge status: Home / Self Care | Payer: BLUE CROSS/BLUE SHIELD | Attending: Emergency Medicine | Admitting: Emergency Medicine

## 2018-12-07 ENCOUNTER — Other Ambulatory Visit: Payer: Self-pay

## 2018-12-07 DIAGNOSIS — R002 Palpitations: Secondary | ICD-10-CM

## 2018-12-07 DIAGNOSIS — R0789 Other chest pain: Secondary | ICD-10-CM | POA: Diagnosis not present

## 2018-12-07 DIAGNOSIS — Z79899 Other long term (current) drug therapy: Secondary | ICD-10-CM | POA: Insufficient documentation

## 2018-12-07 DIAGNOSIS — R079 Chest pain, unspecified: Secondary | ICD-10-CM | POA: Diagnosis present

## 2018-12-07 LAB — CBC WITH DIFFERENTIAL/PLATELET
Abs Immature Granulocytes: 0.03 10*3/uL (ref 0.00–0.07)
Basophils Absolute: 0 10*3/uL (ref 0.0–0.1)
Basophils Relative: 0 %
Eosinophils Absolute: 0.1 10*3/uL (ref 0.0–0.5)
Eosinophils Relative: 2 %
HCT: 35.9 % — ABNORMAL LOW (ref 36.0–46.0)
Hemoglobin: 10.8 g/dL — ABNORMAL LOW (ref 12.0–15.0)
Immature Granulocytes: 1 %
Lymphocytes Relative: 42 %
Lymphs Abs: 2.6 10*3/uL (ref 0.7–4.0)
MCH: 23.9 pg — ABNORMAL LOW (ref 26.0–34.0)
MCHC: 30.1 g/dL (ref 30.0–36.0)
MCV: 79.4 fL — ABNORMAL LOW (ref 80.0–100.0)
Monocytes Absolute: 0.5 10*3/uL (ref 0.1–1.0)
Monocytes Relative: 7 %
Neutro Abs: 3.1 10*3/uL (ref 1.7–7.7)
Neutrophils Relative %: 48 %
Platelets: 336 10*3/uL (ref 150–400)
RBC: 4.52 MIL/uL (ref 3.87–5.11)
RDW: 16.6 % — ABNORMAL HIGH (ref 11.5–15.5)
WBC: 6.3 10*3/uL (ref 4.0–10.5)
nRBC: 0 % (ref 0.0–0.2)

## 2018-12-07 LAB — COMPREHENSIVE METABOLIC PANEL
ALT: 14 U/L (ref 0–44)
AST: 18 U/L (ref 15–41)
Albumin: 4 g/dL (ref 3.5–5.0)
Alkaline Phosphatase: 83 U/L (ref 38–126)
Anion gap: 8 (ref 5–15)
BUN: 8 mg/dL (ref 6–20)
CO2: 22 mmol/L (ref 22–32)
Calcium: 8.9 mg/dL (ref 8.9–10.3)
Chloride: 105 mmol/L (ref 98–111)
Creatinine, Ser: 0.88 mg/dL (ref 0.44–1.00)
GFR calc Af Amer: >60 mL/min (ref >60)
GFR calc non Af Amer: >60 mL/min (ref >60)
Glucose, Bld: 106 mg/dL — ABNORMAL HIGH (ref 70–99)
Potassium: 3.6 mmol/L (ref 3.5–5.1)
Sodium: 135 mmol/L (ref 135–145)
Total Bilirubin: 0.4 mg/dL (ref 0.3–1.2)
Total Protein: 7.1 g/dL (ref 6.5–8.1)

## 2018-12-07 LAB — LIPASE, BLOOD: Lipase: 26 U/L (ref 11–51)

## 2018-12-07 LAB — TROPONIN I: Troponin I: <0.03 ng/mL (ref <0.03)

## 2018-12-07 NOTE — ED Triage Notes (Signed)
Pt reports she had chest pain under her breasts last night that "felt like a cramp". States it feels tight now

## 2018-12-07 NOTE — ED Notes (Signed)
Taken to xray at this time. 

## 2018-12-07 NOTE — ED Provider Notes (Signed)
[facility] EMERGENCY DEPARTMENT Provider Note   CSN: [id number] Arrival date & time: 12/07/18  2113    History   Chief Complaint Chief Complaint  Patient presents with  . Chest Pain    HPI Tara Spencer is a 48 y.o. female with no significant past medical history who presents today for evaluation of chest pain.  She reports that since last night she has chest pain under her breast on the left side.  She says that it does not feel like it is the breast itself that is painful rather deep to there.  She reports that it feels like a cramp.  Since yesterday she has been having intermittent "abnormal sensation" in her chest.  She has previously been seen for palpitations and then she followed up with her primary care doctor they did not do additional evaluation.  She denies any radiation of pain.  No nausea or vomiting.  No dizziness, lightheadedness or weakness.  She denies any pain in her left arm or left jaw.  She does report that yesterday she had coffee and she normally does not have any caffeine.    When I mentioned that we would obtain pregnancy test patient states that she has not been sexually active in three years and declined test.   She denies any hormone use.  She has not had any recent leg swelling, surgeries or immobilizations.  No hemoptysis.  No history of PE/DVT.     HPI  Past Medical History:  Diagnosis Date  . No pertinent past medical history     There are no active problems to display for this patient.   Past Surgical History:  Procedure Laterality Date  . NO PAST SURGERIES       OB History    Gravida  6   Para  6   Term  6   Preterm      AB      Living  6     SAB      TAB      Ectopic      Multiple      Live Births               Home Medications    Prior to Admission medications   Medication Sig Start Date End Date Taking? Authorizing Provider  dolutegravir (TIVICAY) 50 MG tablet Take 1 tablet (50 mg total) by  mouth daily. 09/16/17   Ward, Chase Picket, PA-C  emtricitabine-tenofovir (TRUVADA) 200-300 MG tablet Take 1 tablet by mouth daily. 09/16/17   Ward, Chase Picket, PA-C    Family History Family History  Problem Relation Age of Onset  . Other Neg Hx     Social History Social History   Tobacco Use  . Smoking status: Never Smoker  . Smokeless tobacco: Never Used  Substance Use Topics  . Alcohol use: Not Currently    Comment: social   . Drug use: No     Allergies   Sulfa antibiotics   Review of Systems Review of Systems  Constitutional: Negative for chills and fever.  HENT: Negative for congestion.   Eyes: Negative for pain and visual disturbance.  Respiratory: Negative for cough, chest tightness and shortness of breath.   Cardiovascular: Positive for chest pain and palpitations. Negative for leg swelling.  Gastrointestinal: Negative for abdominal pain, diarrhea, nausea and vomiting.  Genitourinary: Negative for frequency, pelvic pain and vaginal pain.  Musculoskeletal: Negative for back pain and neck pain.  Skin: Negative for wound.  Neurological: Negative for weakness and headaches.  Psychiatric/Behavioral: Negative for confusion. The patient is not nervous/anxious.   All other systems reviewed and are negative.    Physical Exam Updated Vital Signs BP 117/67   Pulse 75   Temp 98.2 F (36.8 C) (Oral)   Resp (!) 21   Ht  (1.626 m)   Wt 90.7 kg   LMP 11/25/2018   SpO2 99%   BMI 34.33 kg/m   Physical Exam Vitals signs and nursing note reviewed.  Constitutional:      General: She is not in acute distress.    Appearance: She is well-developed. She is not ill-appearing.  HENT:     Head: Normocephalic and atraumatic.  Eyes:     Conjunctiva/sclera: Conjunctivae normal.  Neck:     Musculoskeletal: Normal range of motion and neck supple.     Vascular: No JVD.     Trachea: No tracheal deviation.  Cardiovascular:     Rate and Rhythm: Normal rate. Rhythm  irregular.     Pulses:          Radial pulses are 2+ on the right side and 2+ on the left side.       Dorsalis pedis pulses are 2+ on the right side and 2+ on the left side.       Posterior tibial pulses are 2+ on the right side and 2+ on the left side.     Heart sounds: Normal heart sounds. No murmur.  Pulmonary:     Effort: Pulmonary effort is normal. No respiratory distress.     Breath sounds: Normal breath sounds. No decreased breath sounds.  Abdominal:     Palpations: Abdomen is soft.     Tenderness: There is no abdominal tenderness. There is no guarding.  Musculoskeletal:     Right lower leg: She exhibits no tenderness. No edema.     Left lower leg: She exhibits no tenderness. No edema.  Skin:    General: Skin is warm and dry.  Neurological:     General: No focal deficit present.     Mental Status: She is alert.     Cranial Nerves: No cranial nerve deficit.  Psychiatric:        Mood and Affect: Mood normal.        Behavior: Behavior normal.      ED Treatments / Results  Labs (all labs ordered are listed, but only abnormal results are displayed) Labs Reviewed  COMPREHENSIVE METABOLIC PANEL - Abnormal; Notable for the following components:      Result Value   Glucose, Bld 106 (*)    All other components within normal limits  CBC WITH DIFFERENTIAL/PLATELET - Abnormal; Notable for the following components:   Hemoglobin 10.8 (*)    HCT 35.9 (*)    MCV 79.4 (*)    MCH 23.9 (*)    RDW 16.6 (*)    All other components within normal limits  LIPASE, BLOOD  TROPONIN I    EKG EKG Interpretation  Date/Time:  Saturday Dec 07 2018 21:21:49 EDT Ventricular Rate:  93 PR Interval:    QRS Duration: 81 QT Interval:  349 QTC Calculation: 435 R Axis:   76 Text Interpretation:  Sinus rhythm Ventricular premature complex Abnormal R-wave progression, early transition Baseline wander in lead(s) V6 No significant change since last tracing Confirmed by Melene Plan [phone]) on  12/07/2018 9:47:35 PM   Radiology Dg Chest 2 View  Result Date: 12/07/2018 CLINICAL DATA:  48 y/o  F; 24 hours of chest pain. EXAM: CHEST - 2 VIEW COMPARISON:  12/14/2016 chest radiograph. FINDINGS: Stable heart size and mediastinal contours are within normal limits. Both lungs are clear. The visualized skeletal structures are unremarkable. IMPRESSION: No active cardiopulmonary disease. Electronically Signed   By: Mitzi HansenLance  Furusawa-Stratton M.D.   On: 12/07/2018 22:31    Procedures Procedures (including critical care time)  Medications Ordered in ED Medications - No data to display   Initial Impression / Assessment and Plan / ED Course  I have reviewed the triage vital signs and the nursing notes.  Pertinent labs & imaging results that were available during my care of the patient were reviewed by me and considered in my medical decision making (see chart for details).       Patient is to be discharged with recommendation to follow up with PCP in regards to today's hospital visit. Chest pain is not likely of cardiac or pulmonary etiology d/t presentation, PERC negative, VSS, no tracheal deviation, no JVD or new murmur, RRR, breath sounds equal bilaterally, EKG without acute abnormalities, negative troponin, and negative CXR. She does have palpitations which she can feel when she has a PVC.  She was observed in the ER with out significant arrhythmia.  Her palpitations started after she consumed caffeine for the first time in a long time. I discussed repeat troponin and beta blocker vs cardiology consult which patient declined.  She does not have a family history of MI before age of 49.  Heart score is 2.  Pt has been advised to return to the ED if CP becomes exertional, associated with diaphoresis or nausea, radiates to left jaw/arm, worsens or becomes concerning in any way. Pt appears reliable for follow up and is agreeable to discharge.   We discussed that she is anemic, recommended PCP follow  up.  Do not suspect that this is contributing to her symptoms.  Recomeded avoiding caffeine.   Case has been discussed with Dr. Adela LankFloyd who agrees with the above plan to discharge.   Return precautions were discussed with patient who states their understanding.  At the time of discharge patient denied any unaddressed complaints or concerns.  Patient is agreeable for discharge home.  Final Clinical Impressions(s) / ED Diagnoses   Final diagnoses:  Palpitations  Atypical chest pain    ED Discharge Orders    None       Cristina GongHammond, Warwick Nick W, Cordelia Poche-C 12/08/18 0109    Melene PlanFloyd, Dan, DO 12/08/18 1511

## 2018-12-08 NOTE — Discharge Instructions (Signed)
As we discussed today please avoid caffeine.  If your symptoms worsen or you have additional concerns please seek additional medical care and evaluation.

## 2018-12-12 ENCOUNTER — Other Ambulatory Visit: Payer: Self-pay | Admitting: Physician Assistant

## 2018-12-12 DIAGNOSIS — Z1231 Encounter for screening mammogram for malignant neoplasm of breast: Secondary | ICD-10-CM

## 2019-10-10 ENCOUNTER — Other Ambulatory Visit: Payer: Self-pay

## 2019-10-10 ENCOUNTER — Ambulatory Visit
Admission: RE | Admit: 2019-10-10 | Discharge: 2019-10-10 | Disposition: A | Discharge status: Home / Self Care | Payer: BLUE CROSS/BLUE SHIELD | Source: Ambulatory Visit | Attending: Physician Assistant | Admitting: Physician Assistant

## 2019-10-10 DIAGNOSIS — Z1231 Encounter for screening mammogram for malignant neoplasm of breast: Secondary | ICD-10-CM

## 2020-05-12 ENCOUNTER — Emergency Department (HOSPITAL_COMMUNITY): Payer: BC Managed Care – PPO

## 2020-05-12 ENCOUNTER — Other Ambulatory Visit: Payer: Self-pay

## 2020-05-12 ENCOUNTER — Emergency Department (HOSPITAL_COMMUNITY)
Admission: EM | Admit: 2020-05-12 | Discharge: 2020-05-12 | Disposition: A | Discharge status: Home / Self Care | Payer: BC Managed Care – PPO | Attending: Emergency Medicine | Admitting: Emergency Medicine

## 2020-05-12 ENCOUNTER — Encounter (HOSPITAL_COMMUNITY): Payer: Self-pay

## 2020-05-12 DIAGNOSIS — R079 Chest pain, unspecified: Secondary | ICD-10-CM | POA: Insufficient documentation

## 2020-05-12 LAB — TROPONIN I (HIGH SENSITIVITY): Troponin I (High Sensitivity): 3 ng/L (ref <18)

## 2020-05-12 LAB — CBC
HCT: 38.5 % (ref 36.0–46.0)
Hemoglobin: 11.6 g/dL — ABNORMAL LOW (ref 12.0–15.0)
MCH: 24.6 pg — ABNORMAL LOW (ref 26.0–34.0)
MCHC: 30.1 g/dL (ref 30.0–36.0)
MCV: 81.7 fL (ref 80.0–100.0)
Platelets: 376 10*3/uL (ref 150–400)
RBC: 4.71 MIL/uL (ref 3.87–5.11)
RDW: 16.5 % — ABNORMAL HIGH (ref 11.5–15.5)
WBC: 4.8 10*3/uL (ref 4.0–10.5)
nRBC: 0 % (ref 0.0–0.2)

## 2020-05-12 LAB — BASIC METABOLIC PANEL
Anion gap: 8 (ref 5–15)
BUN: 7 mg/dL (ref 6–20)
CO2: 25 mmol/L (ref 22–32)
Calcium: 10 mg/dL (ref 8.9–10.3)
Chloride: 104 mmol/L (ref 98–111)
Creatinine, Ser: 0.94 mg/dL (ref 0.44–1.00)
GFR, Estimated: >60 mL/min (ref >60)
Glucose, Bld: 89 mg/dL (ref 70–99)
Potassium: 3.9 mmol/L (ref 3.5–5.1)
Sodium: 137 mmol/L (ref 135–145)

## 2020-05-12 LAB — I-STAT BETA HCG BLOOD, ED (MC, WL, AP ONLY): I-stat hCG, quantitative: <5 m[IU]/mL (ref <5)

## 2020-05-12 MED ORDER — SUCRALFATE 1 G PO TABS: 1.0000 g | ORAL_TABLET | Freq: Four times a day (QID) | ORAL | 0 refills | Status: AC | PRN

## 2020-05-12 MED ORDER — OMEPRAZOLE 20 MG PO CPDR: 20.0000 mg | DELAYED_RELEASE_CAPSULE | Freq: Every day | ORAL | 1 refills | Status: AC

## 2020-05-12 NOTE — ED Triage Notes (Signed)
Pt reports Left side chest pain radiating to her back and Left arm x1 week. Pt denies N/V or SOB

## 2020-05-12 NOTE — ED Provider Notes (Signed)
[facility] DEPT [facility] Emergency Department Provider Note MRN:  [id number]  Arrival date & time: 05/12/20     Chief Complaint   Chest Pain   History of Present Illness   Tara Spencer is a 49 y.o. year-old female with no pertinent past medical history presenting to the ED with chief complaint of chest pain.  1 week of constant burning chest pain, concerned because it is not going away.  Mild to moderate in severity.  Denies any worsening with deep breaths, no worsening with exertion, no worsening with movements.  No dizziness or diaphoresis, no shortness of breath, no nausea or vomiting.  Review of Systems  A complete 10 system review of systems was obtained and all systems are negative except as noted in the HPI and PMH.   Patient's Health History    Past Medical History:  Diagnosis Date  . No pertinent past medical history     Past Surgical History:  Procedure Laterality Date  . NO PAST SURGERIES      Family History  Problem Relation Age of Onset  . Other Neg Hx     Social History   Socioeconomic History  . Marital status: Single    Spouse name: Not on file  . Number of children: Not on file  . Years of education: Not on file  . Highest education level: Not on file  Occupational History  . Not on file  Tobacco Use  . Smoking status: Never Smoker  . Smokeless tobacco: Never Used  Substance and Sexual Activity  . Alcohol use: Not Currently    Comment: social   . Drug use: No  . Sexual activity: Not Currently  Other Topics Concern  . Not on file  Social History Narrative  . Not on file   Social Determinants of Health   Financial Resource Strain:   . Difficulty of Paying Living Expenses: Not on file  Food Insecurity:   . Worried About Programme researcher, broadcasting/film/video in the Last Year: Not on file  . Ran Out of Food in the Last Year: Not on file  Transportation Needs:   . Lack of Transportation (Medical): Not on file  . Lack of Transportation  (Non-Medical): Not on file  Physical Activity:   . Days of Exercise per Week: Not on file  . Minutes of Exercise per Session: Not on file  Stress:   . Feeling of Stress : Not on file  Social Connections:   . Frequency of Communication with Friends and Family: Not on file  . Frequency of Social Gatherings with Friends and Family: Not on file  . Attends Religious Services: Not on file  . Active Member of Clubs or Organizations: Not on file  . Attends Banker Meetings: Not on file  . Marital Status: Not on file  Intimate Partner Violence:   . Fear of Current or Ex-Partner: Not on file  . Emotionally Abused: Not on file  . Physically Abused: Not on file  . Sexually Abused: Not on file     Physical Exam   Vitals:   05/12/20 1518  BP: 119/77  Pulse: 67  Resp: 12  Temp: 98.2 F (36.8 C)  SpO2: 100%    CONSTITUTIONAL: Well-appearing, NAD NEURO:  Alert and oriented x 3, no focal deficits EYES:  eyes equal and reactive ENT/NECK:  no LAD, no JVD CARDIO: Regular rate, well-perfused, normal S1 and S2 PULM:  CTAB no wheezing or rhonchi GI/GU:  normal bowel sounds, non-distended,  non-tender MSK/SPINE:  No gross deformities, no edema SKIN:  no rash, atraumatic PSYCH:  Appropriate speech and behavior  *Additional and/or pertinent findings included in MDM below  Diagnostic and Interventional Summary    EKG Interpretation  Date/Time:  Wednesday May 12 2020 15:24:18 EDT Ventricular Rate:  70 PR Interval:  150 QRS Duration: 78 QT Interval:  364 QTC Calculation: 393 R Axis:   76 Text Interpretation: Normal sinus rhythm Normal ECG No significant change was found Confirmed by Kennis Carina [phone]) on 05/12/2020 4:36:57 PM      Labs Reviewed  CBC - Abnormal; Notable for the following components:      Result Value   Hemoglobin 11.6 (*)    MCH 24.6 (*)    RDW 16.5 (*)    All other components within normal limits  BASIC METABOLIC PANEL  I-STAT BETA HCG BLOOD,  ED ([facility], [facility], [facility] ONLY)  TROPONIN I (HIGH SENSITIVITY)  TROPONIN I (HIGH SENSITIVITY)    DG Chest 2 View  Final Result      Medications - No data to display   Procedures  /  Critical Care Procedures  ED Course and Medical Decision Making  I have reviewed the triage vital signs, the nursing notes, and pertinent available records from the EMR.  Listed above are laboratory and imaging tests that I personally ordered, reviewed, and interpreted and then considered in my medical decision making (see below for details).  Favoring GI etiology, very little cardiovascular risk factors, EKG is reassuring, troponin is negative.  No evidence of DVT, no hypoxia, no tachypnea, pain is not pleuritic, doubt PE.  Appropriate for discharge.       Elmer Sow. Pilar Plate, MD [facility] Emergency Medicine [facility] [email or web address]  Final Clinical Impressions(s) / ED Diagnoses     ICD-10-CM   1. Chest pain, unspecified type  R07.9     ED Discharge Orders         Ordered    sucralfate (CARAFATE) 1 g tablet  4 times daily PRN        05/12/20 1730    omeprazole (PRILOSEC) 20 MG capsule  Daily        05/12/20 1730           Discharge Instructions Discussed with and Provided to Patient:     Discharge Instructions     You were evaluated in the Emergency Department and after careful evaluation, we did not find any emergent condition requiring admission or further testing in the hospital.  Your exam/testing today is overall reassuring.  Your symptoms may be due to acid reflux related pain.  Please take the omeprazole medication daily as a preventative medication.  For pain during the day, you can take the Carafate medication as needed up to 4 times per day.  Please return to the Emergency Department if you experience any worsening of your condition.   Thank you for allowing [place] to be a part of your care.      Sabas Sous, MD 05/12/20 [phone]

## 2020-05-12 NOTE — Discharge Instructions (Addendum)
You were evaluated in the Emergency Department and after careful evaluation, we did not find any emergent condition requiring admission or further testing in the hospital.  Your exam/testing today is overall reassuring.  Your symptoms may be due to acid reflux related pain.  Please take the omeprazole medication daily as a preventative medication.  For pain during the day, you can take the Carafate medication as needed up to 4 times per day.  Please return to the Emergency Department if you experience any worsening of your condition.   Thank you for allowing [place] to be a part of your care.

## 2021-01-20 IMAGING — MG DIGITAL SCREENING BILAT W/ CAD
4 series · 4 of 4 positions shown · non-contrast
Comparison: None.

CLINICAL DATA: Screening.

EXAM:
DIGITAL SCREENING BILATERAL MAMMOGRAM WITH CAD

[L MLO]
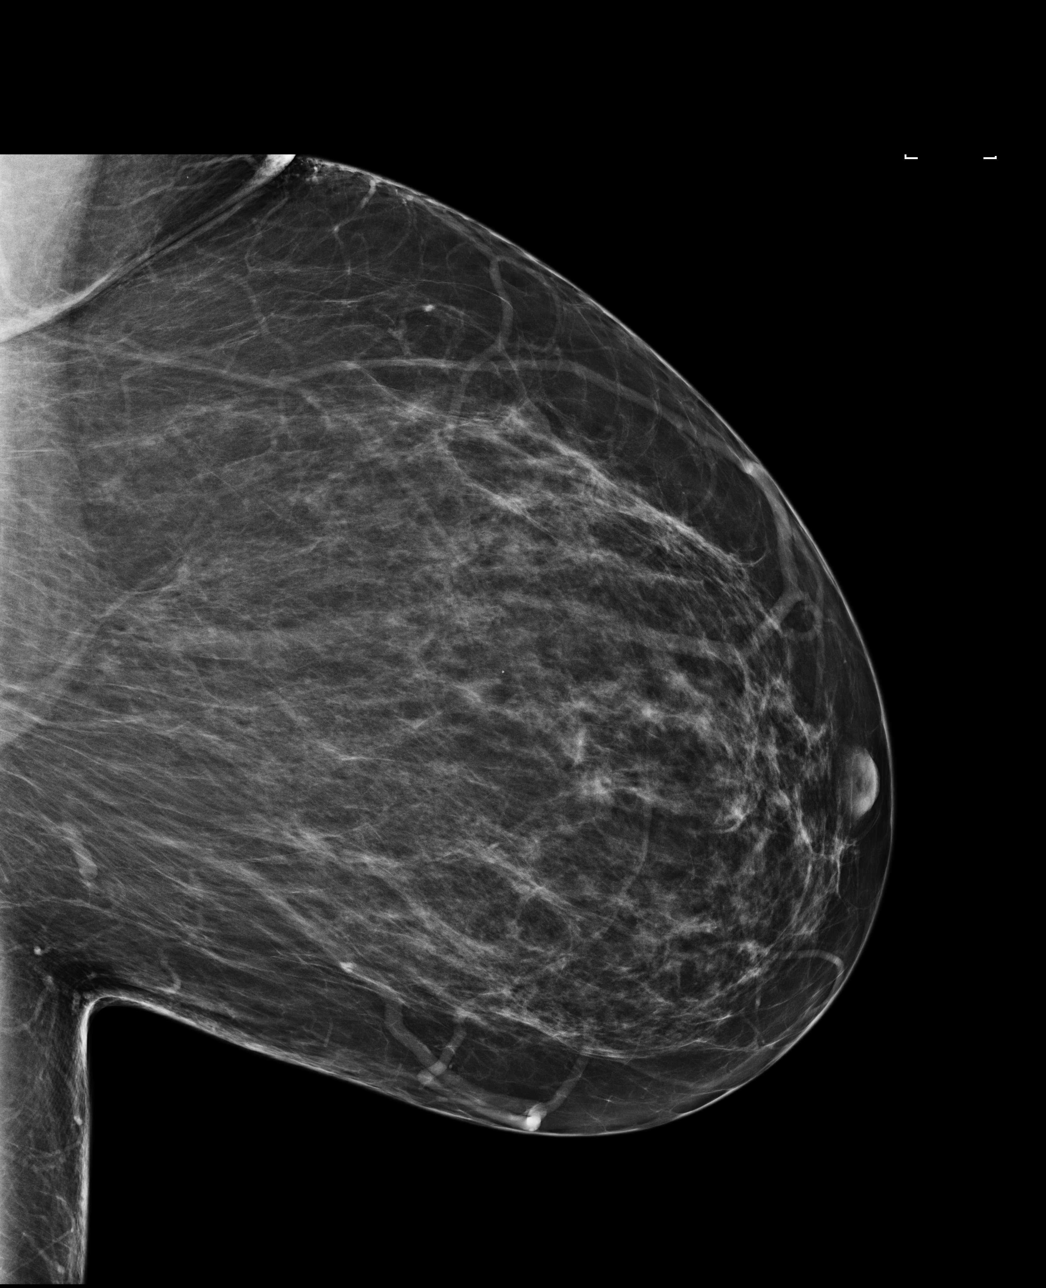

[R CC]
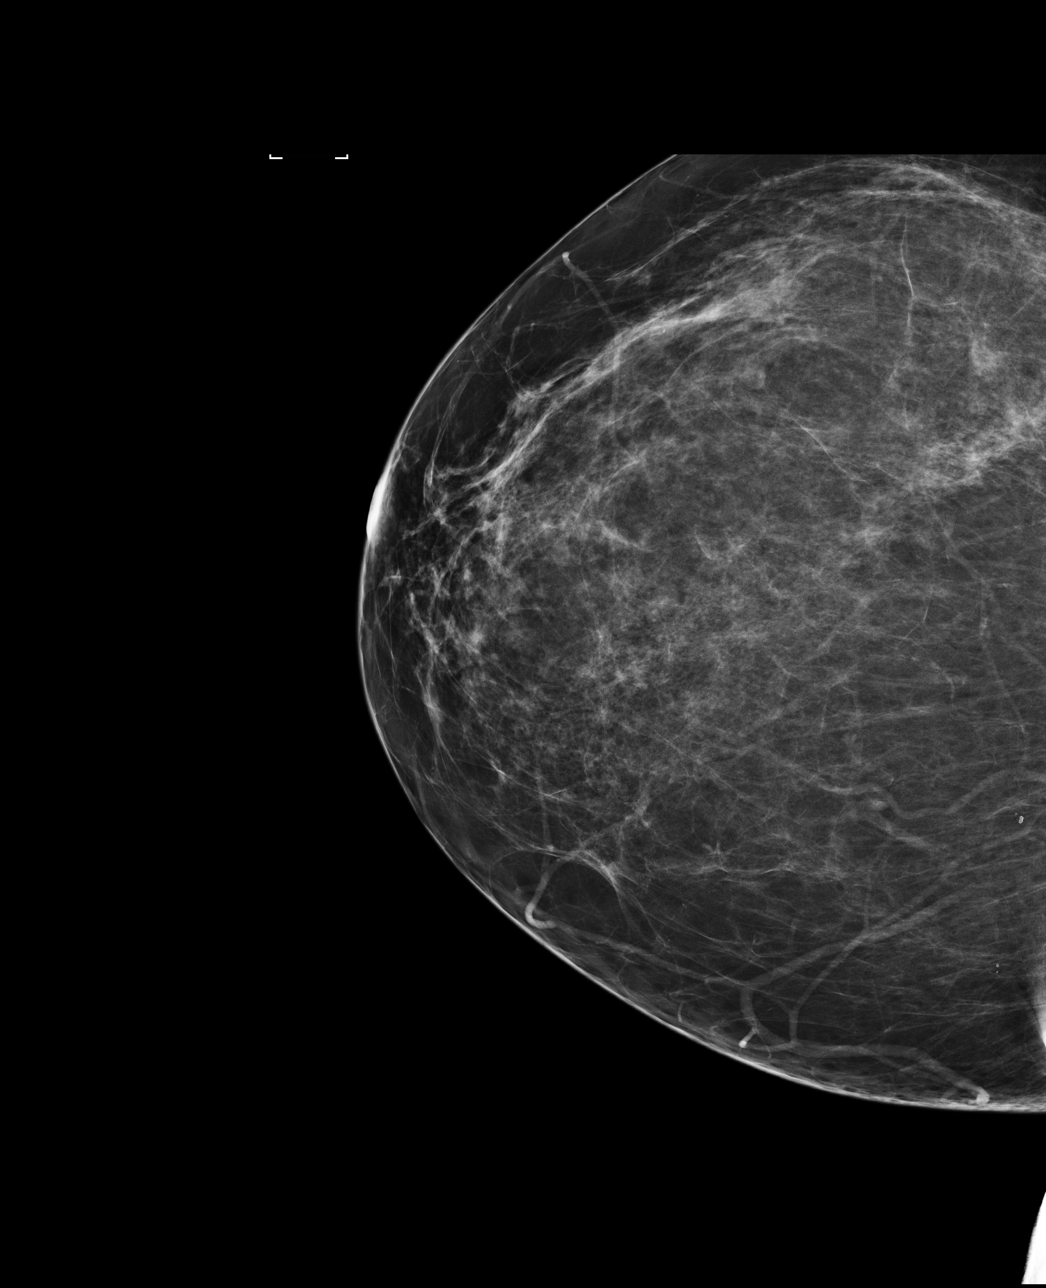

[L CC]
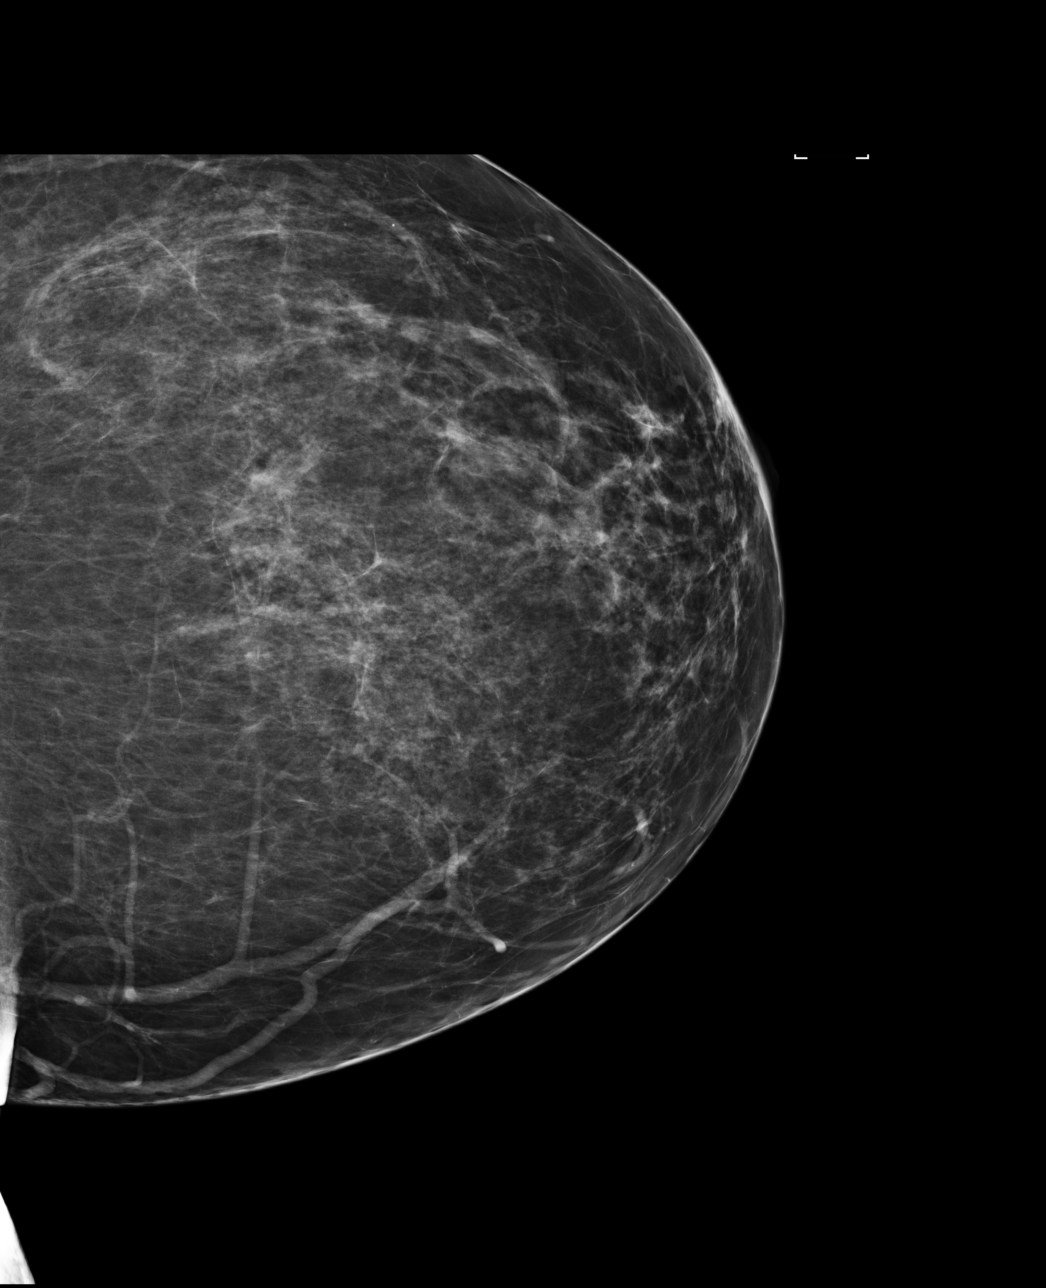

[R MLO]
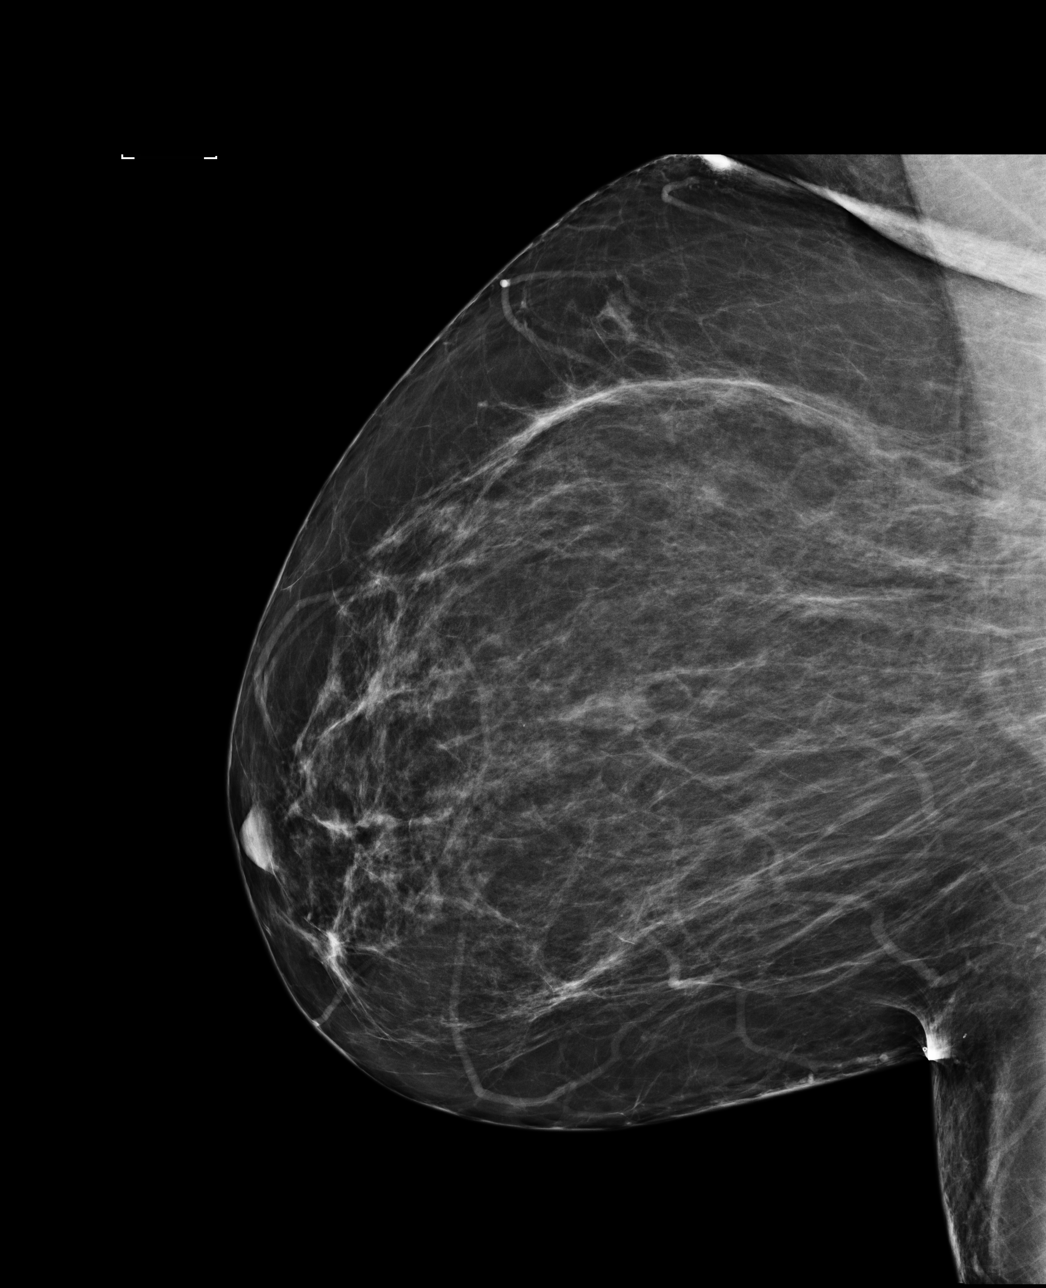

[4 of 4 positions shown; findings below may reference images not displayed]

ACR Breast Density Category b: There are scattered areas of
fibroglandular density.
FINDINGS: There are no findings suspicious for malignancy. Images were
processed with CAD.
IMPRESSION: No mammographic evidence of malignancy. A result letter of this
screening mammogram will be mailed directly to the patient.

RECOMMENDATION:
Screening mammogram in one year. (Code:[id number])

BI-RADS CATEGORY  1: Negative.

## 2021-08-23 IMAGING — DX DG CHEST 2V
2 series · 2 of 2 positions shown · non-contrast
Comparison: December 07, 2018.

CLINICAL DATA: Chest pain.

EXAM:
CHEST - 2 VIEW

[chest pa]
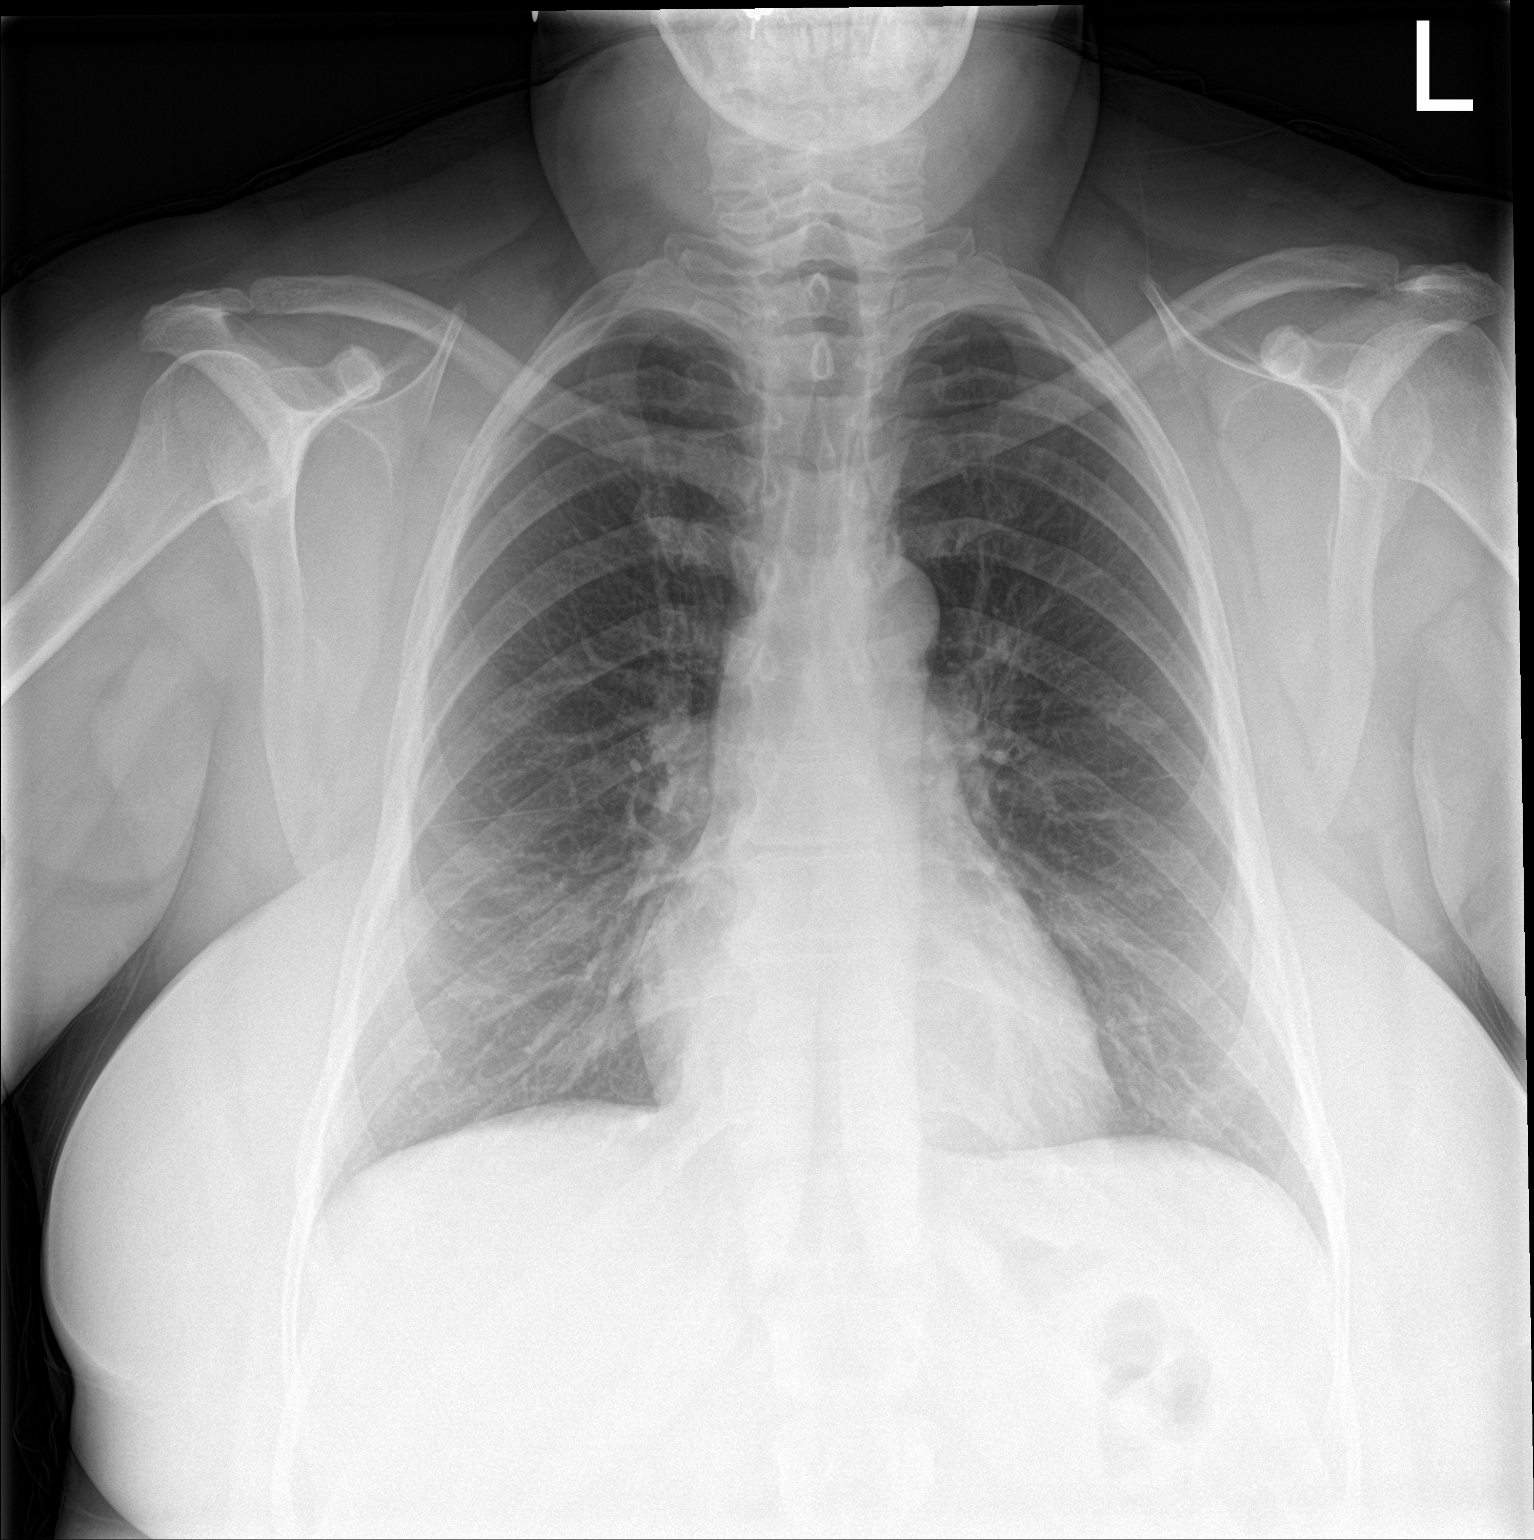

[chest lat]
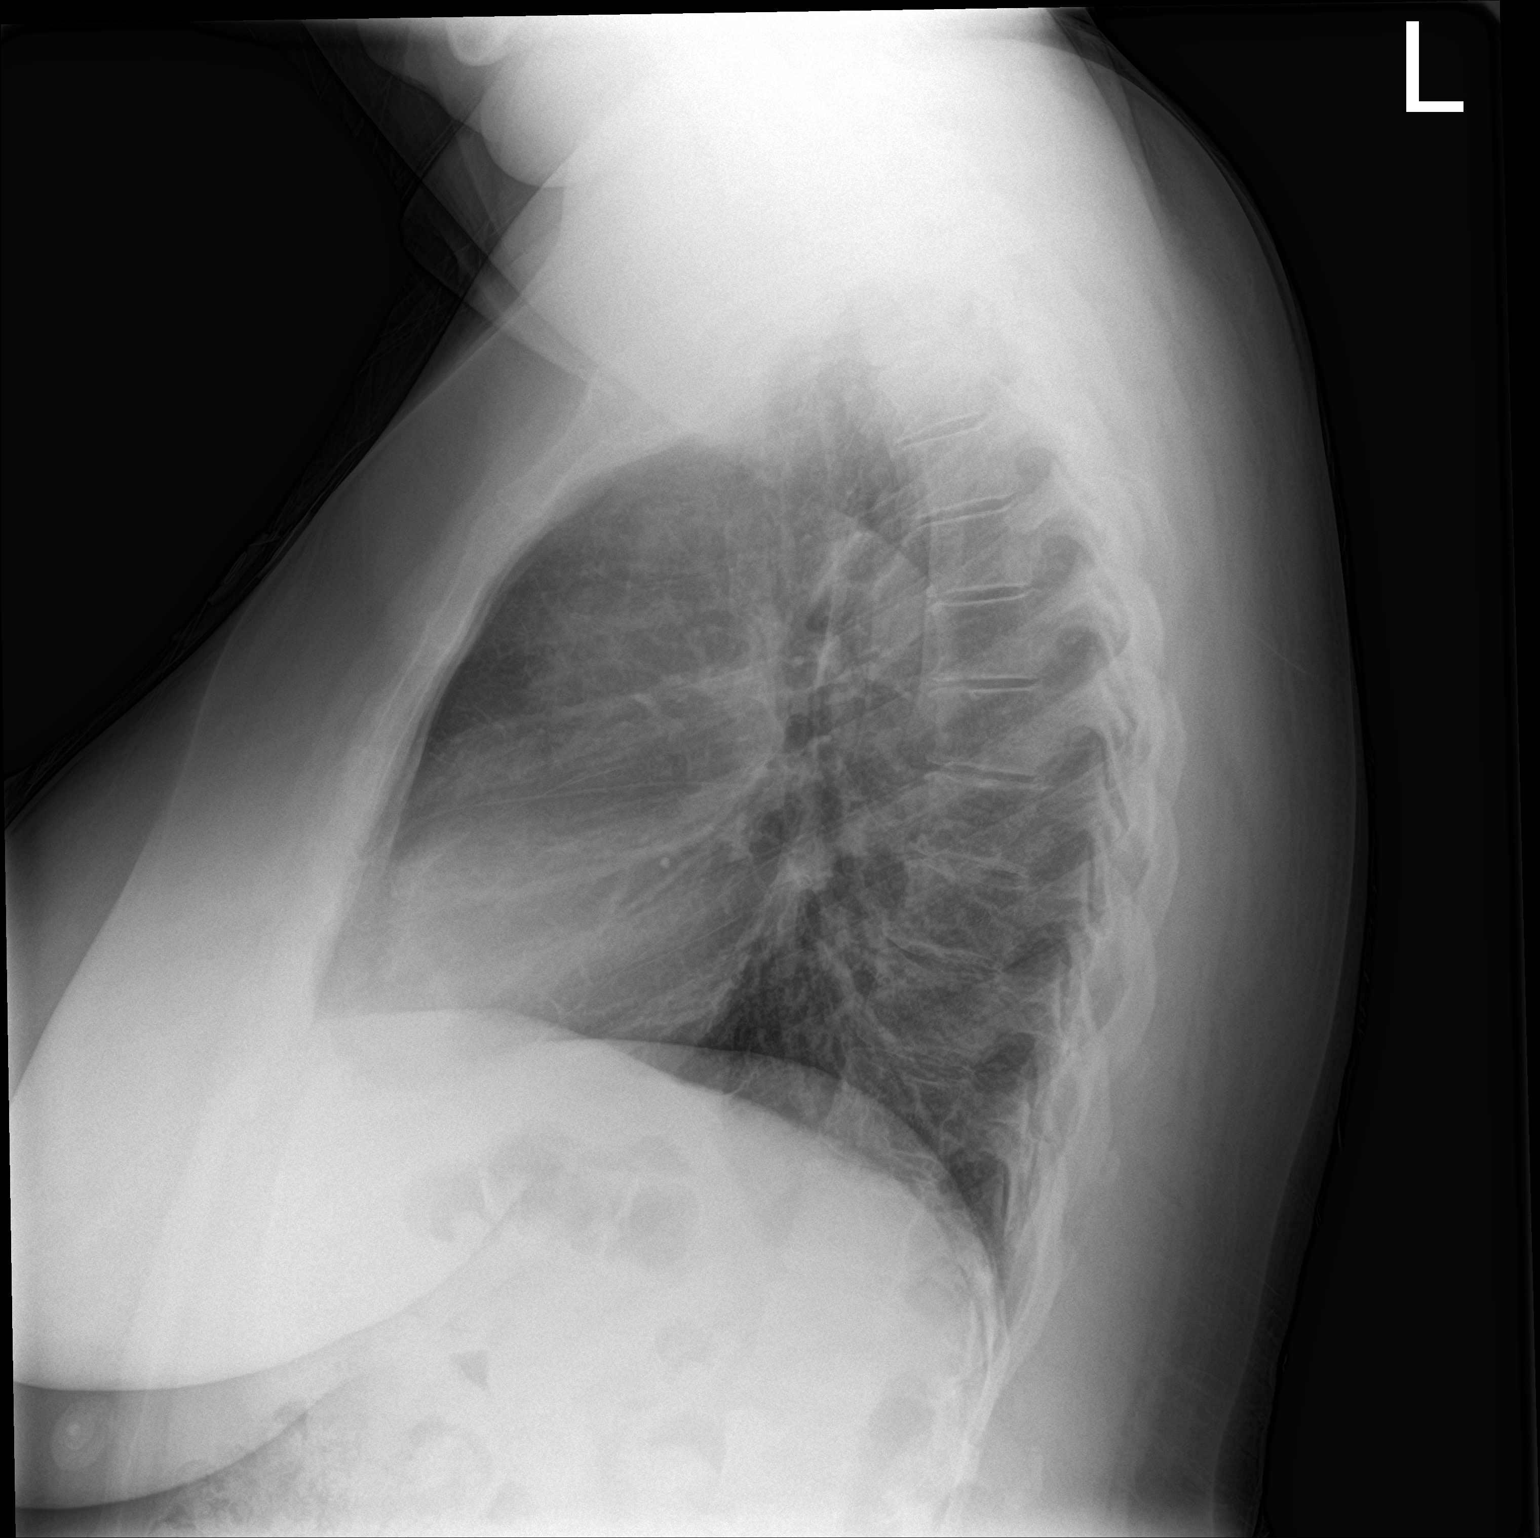

[2 of 2 positions shown; findings below may reference images not displayed]

FINDINGS: The heart size and mediastinal contours are within normal limits.
Both lungs are clear. No pneumothorax or pleural effusion is noted.
The visualized skeletal structures are unremarkable.
IMPRESSION: No active cardiopulmonary disease.
